# Patient Record
Sex: Male | Born: 1965 | Race: White | Hispanic: No | Marital: Married | Smoking: Current every day smoker
Health system: Southern US, Community
[De-identification: ages and names within clinical notes are randomized; demographics above are authoritative.]

## PROBLEM LIST (undated history)

## (undated) DIAGNOSIS — M5136 Other intervertebral disc degeneration, lumbar region: Secondary | ICD-10-CM

## (undated) DIAGNOSIS — F410 Panic disorder [episodic paroxysmal anxiety] without agoraphobia: Secondary | ICD-10-CM

## (undated) DIAGNOSIS — I10 Essential (primary) hypertension: Secondary | ICD-10-CM

## (undated) DIAGNOSIS — F419 Anxiety disorder, unspecified: Secondary | ICD-10-CM

## (undated) DIAGNOSIS — M5126 Other intervertebral disc displacement, lumbar region: Secondary | ICD-10-CM

## (undated) HISTORY — PX: HYDROCELE EXCISION / REPAIR: SUR1145

## (undated) HISTORY — PX: APPENDECTOMY: SHX54

## (undated) HISTORY — PX: HERNIA REPAIR: SHX51

---

## 2013-04-25 DIAGNOSIS — N433 Hydrocele, unspecified: Secondary | ICD-10-CM | POA: Insufficient documentation

## 2014-03-09 ENCOUNTER — Ambulatory Visit (INDEPENDENT_AMBULATORY_CARE_PROVIDER_SITE_OTHER): Payer: Self-pay | Admitting: Family Medicine

## 2014-03-09 ENCOUNTER — Encounter (INDEPENDENT_AMBULATORY_CARE_PROVIDER_SITE_OTHER): Payer: Self-pay

## 2014-03-09 DIAGNOSIS — E785 Hyperlipidemia, unspecified: Secondary | ICD-10-CM

## 2014-03-09 DIAGNOSIS — F411 Generalized anxiety disorder: Secondary | ICD-10-CM

## 2014-03-09 DIAGNOSIS — R5383 Other fatigue: Secondary | ICD-10-CM

## 2014-03-09 DIAGNOSIS — E559 Vitamin D deficiency, unspecified: Secondary | ICD-10-CM

## 2014-03-09 DIAGNOSIS — I10 Essential (primary) hypertension: Secondary | ICD-10-CM

## 2014-03-09 LAB — POCT CBC
GRANULOCYTE PERCENT: 76.7 % (ref 37–80)
HCT, POC: 48.3 % (ref 43.5–53.7)
Hemoglobin: 15.4 g/dL (ref 14.1–18.1)
Lymph, poc: 1.5 (ref 0.6–3.4)
MCH, POC: 30.2 pg (ref 27–31.2)
MCHC: 31.9 g/dL (ref 31.8–35.4)
MCV: 94.6 fL (ref 80–97)
MPV: 7.7 fL (ref 0–99.8)
PLATELET COUNT, POC: 311 10*3/uL (ref 142–424)
POC Granulocyte: 5.4 (ref 2–6.9)
POC LYMPH %: 20.8 % (ref 10–50)
RBC: 5.1 M/uL (ref 4.69–6.13)
RDW, POC: 12.8 %
WBC: 7 10*3/uL (ref 4.6–10.2)

## 2014-03-09 MED ORDER — LORAZEPAM 0.5 MG PO TABS: 0.5000 mg | ORAL_TABLET | Freq: Three times a day (TID) | ORAL | Status: DC | PRN

## 2014-03-09 MED ORDER — TIZANIDINE HCL 4 MG PO TABS: 4.0000 mg | ORAL_TABLET | Freq: Four times a day (QID) | ORAL | Status: DC | PRN

## 2014-03-09 MED ORDER — BUSPIRONE HCL 15 MG PO TABS: 15.0000 mg | ORAL_TABLET | Freq: Two times a day (BID) | ORAL | Status: DC

## 2014-03-09 NOTE — Progress Notes (Signed)
   Subjective:    Patient ID: Stephen Campos, male    DOB: 08/20/1965, 49 y.o.   MRN: [id number]  HPI  [facility].  He is a former patient of Dr Darlyn ReadStacks.  He is here for a follow up of chronic medical problems which include hypertension, hyperlipidemia, Vitamin D deficiency and chronic back pain.    Review of Systems     Objective:   Physical Exam There were no vitals taken for this visit.        Assessment & Plan:  There were no vitals taken for this visit.

## 2014-03-09 NOTE — Patient Instructions (Signed)
DASH Eating Plan °DASH stands for "Dietary Approaches to Stop Hypertension." The DASH eating plan is a healthy eating plan that has been shown to reduce high blood pressure (hypertension). Additional health benefits may include reducing the risk of type 2 diabetes mellitus, heart disease, and stroke. The DASH eating plan may also help with weight loss. °WHAT DO I NEED TO KNOW ABOUT THE DASH EATING PLAN? °For the DASH eating plan, you will follow these general guidelines: °· Choose foods with a percent daily value for sodium of less than 5% (as listed on the food label). °· Use salt-free seasonings or herbs instead of table salt or sea salt. °· Check with your health care provider or pharmacist before using salt substitutes. °· Eat lower-sodium products, often labeled as "lower sodium" or "no salt added." °· Eat fresh foods. °· Eat more vegetables, fruits, and low-fat dairy products. °· Choose whole grains. Look for the word "whole" as the first word in the ingredient list. °· Choose fish and skinless chicken or turkey more often than red meat. Limit fish, poultry, and meat to 6 oz (170 g) each day. °· Limit sweets, desserts, sugars, and sugary drinks. °· Choose heart-healthy fats. °· Limit cheese to 1 oz (28 g) per day. °· Eat more home-cooked food and less restaurant, buffet, and fast food. °· Limit fried foods. °· Cook foods using methods other than frying. °· Limit canned vegetables. If you do use them, rinse them well to decrease the sodium. °· When eating at a restaurant, ask that your food be prepared with less salt, or no salt if possible. °WHAT FOODS CAN I EAT? °Seek help from a dietitian for individual calorie needs. °Grains °Whole grain or whole wheat bread. Brown rice. Whole grain or whole wheat pasta. Quinoa, bulgur, and whole grain cereals. Low-sodium cereals. Corn or whole wheat flour tortillas. Whole grain cornbread. Whole grain crackers. Low-sodium crackers. °Vegetables °Fresh or frozen vegetables  (raw, steamed, roasted, or grilled). Low-sodium or reduced-sodium tomato and vegetable juices. Low-sodium or reduced-sodium tomato sauce and paste. Low-sodium or reduced-sodium canned vegetables.  °Fruits °All fresh, canned (in natural juice), or frozen fruits. °Meat and Other Protein Products °Ground beef (85% or leaner), grass-fed beef, or beef trimmed of fat. Skinless chicken or turkey. Ground chicken or turkey. Pork trimmed of fat. All fish and seafood. Eggs. Dried beans, peas, or lentils. Unsalted nuts and seeds. Unsalted canned beans. °Dairy °Low-fat dairy products, such as skim or 1% milk, 2% or reduced-fat cheeses, low-fat ricotta or cottage cheese, or plain low-fat yogurt. Low-sodium or reduced-sodium cheeses. °Fats and Oils °Tub margarines without trans fats. Light or reduced-fat mayonnaise and salad dressings (reduced sodium). Avocado. Safflower, olive, or canola oils. Natural peanut or almond butter. °Other °Unsalted popcorn and pretzels. °The items listed above may not be a complete list of recommended foods or beverages. Contact your dietitian for more options. °WHAT FOODS ARE NOT RECOMMENDED? °Grains °White bread. White pasta. White rice. Refined cornbread. Bagels and croissants. Crackers that contain trans fat. °Vegetables °Creamed or fried vegetables. Vegetables in a cheese sauce. Regular canned vegetables. Regular canned tomato sauce and paste. Regular tomato and vegetable juices. °Fruits °Dried fruits. Canned fruit in light or heavy syrup. Fruit juice. °Meat and Other Protein Products °Fatty cuts of meat. Ribs, chicken wings, bacon, sausage, bologna, salami, chitterlings, fatback, hot dogs, bratwurst, and packaged luncheon meats. Salted nuts and seeds. Canned beans with salt. °Dairy °Whole or 2% milk, cream, half-and-half, and cream cheese. Whole-fat or sweetened yogurt. Full-fat   cheeses or blue cheese. Nondairy creamers and whipped toppings. Processed cheese, cheese spreads, or cheese  curds. °Condiments °Onion and garlic salt, seasoned salt, table salt, and sea salt. Canned and packaged gravies. Worcestershire sauce. Tartar sauce. Barbecue sauce. Teriyaki sauce. Soy sauce, including reduced sodium. Steak sauce. Fish sauce. Oyster sauce. Cocktail sauce. Horseradish. Ketchup and mustard. Meat flavorings and tenderizers. Bouillon cubes. Hot sauce. Tabasco sauce. Marinades. Taco seasonings. Relishes. °Fats and Oils °Butter, stick margarine, lard, shortening, ghee, and bacon fat. Coconut, palm kernel, or palm oils. Regular salad dressings. °Other °Pickles and olives. Salted popcorn and pretzels. °The items listed above may not be a complete list of foods and beverages to avoid. Contact your dietitian for more information. °WHERE CAN I FIND MORE INFORMATION? °National Heart, Lung, and [employer] Institute: www.nhlbi.nih.gov/health/health-topics/topics/dash/ °Document Released: 01/08/2011 Document Revised: 06/05/2013 Document Reviewed: 11/23/2012 °ExitCare® Patient Information ©2015 [employer]. This information is not intended to replace advice given to you by your health care provider. Make sure you discuss any questions you have with your health care provider. ° °

## 2014-03-12 LAB — NMR, LIPOPROFILE
Cholesterol: 189 mg/dL (ref 100–199)
HDL Cholesterol by NMR: 53 mg/dL (ref >39)
HDL Particle Number: 48 umol/L (ref >=30.5)
LDL Particle Number: 1112 nmol/L — ABNORMAL HIGH (ref <1000)
LDL Size: 19.6 nm (ref >20.5)
LDL-C: 73 mg/dL (ref 0–99)
LP-IR Score: 82 — ABNORMAL HIGH (ref <=45)
Small LDL Particle Number: 791 nmol/L — ABNORMAL HIGH (ref <=527)
TRIGLYCERIDES BY NMR: 317 mg/dL — AB (ref 0–149)

## 2014-03-12 LAB — THYROID PANEL WITH TSH
Free Thyroxine Index: 1.9 (ref 1.2–4.9)
T3 UPTAKE RATIO: 30 % (ref 24–39)
T4, Total: 6.4 ug/dL (ref 4.5–12.0)
TSH: 1.92 u[IU]/mL (ref 0.450–4.500)

## 2014-03-12 LAB — CMP14+EGFR
A/G RATIO: 1.7 (ref 1.1–2.5)
ALT: 36 IU/L (ref 0–44)
AST: 35 IU/L (ref 0–40)
Albumin: 4.2 g/dL (ref 3.5–5.5)
Alkaline Phosphatase: 84 IU/L (ref 39–117)
BUN / CREAT RATIO: 17 (ref 9–20)
BUN: 16 mg/dL (ref 6–24)
Bilirubin Total: 0.4 mg/dL (ref 0.0–1.2)
CO2: 23 mmol/L (ref 18–29)
CREATININE: 0.96 mg/dL (ref 0.76–1.27)
Calcium: 9.1 mg/dL (ref 8.7–10.2)
Chloride: 105 mmol/L (ref 97–108)
GFR calc Af Amer: 108 mL/min/{1.73_m2} (ref >59)
GFR calc non Af Amer: 94 mL/min/{1.73_m2} (ref >59)
Globulin, Total: 2.5 g/dL (ref 1.5–4.5)
Glucose: 98 mg/dL (ref 65–99)
Potassium: 5.5 mmol/L — ABNORMAL HIGH (ref 3.5–5.2)
Sodium: 143 mmol/L (ref 134–144)
Total Protein: 6.7 g/dL (ref 6.0–8.5)

## 2014-03-12 LAB — VITAMIN D 25 HYDROXY (VIT D DEFICIENCY, FRACTURES): Vit D, 25-Hydroxy: 23.4 ng/mL — ABNORMAL LOW (ref 30.0–100.0)

## 2014-03-13 ENCOUNTER — Telehealth: Payer: Self-pay | Admitting: *Deleted

## 2014-03-13 NOTE — Telephone Encounter (Signed)
-----   Message from Mechele ClaudeWarren Stacks, MD sent at 03/12/2014  6:45 PM EST ----- Stephen JunkerHello Stephen Campos, Your cholesterol was checked for LDL particle size and number. There is a slight excess of particles and a small size, but not sufficient to require more medication. Work on diet exercise and weight loss isntead. Recheck in 6 mos. Best Regards, Mechele ClaudeWarren Stacks, M.D.

## 2014-03-13 NOTE — Telephone Encounter (Signed)
Lmtcb regarding test results.

## 2014-03-13 NOTE — Progress Notes (Signed)
Subjective:  Patient ID: Stephen Campos, male    DOB: May 28, 1965  Age: 49 y.o. MRN: [id number]  CC: Hypertension; Hyperlipidemia; and Back Pain   HPI [name] presents for Patient in for follow-up of elevated cholesterol. Doing well without complaints on current medication. Denies side effects of statin including myalgia and arthralgia and nausea. Also in today for liver function testing. Currently no chest pain, shortness of breath or other cardiovascular related symptoms noted.  Patient in for follow-up of hypertension. Patient has no history of headache chest pain or shortness of breath or recent cough. Patient also denies symptoms of TIA such as numbness weakness lateralizing. Patient checks  blood pressure at home and has not had any elevated readings recently. Patient denies side effects from his medication. States taking it regularly.  Continues to take vitamin D. The patient is due to have the level checked.   Back pain has been increasing recently. He has responded well to tizanidine in the past and would like to have that prescribed again.  History Stephen Campos has no past medical history on file.   He has no past surgical history on file.   His family history is not on file.He has no tobacco, alcohol, and drug history on file.  No current outpatient prescriptions on file prior to visit.   No current facility-administered medications on file prior to visit.    ROS Review of Systems  Constitutional: Negative for fever, chills, diaphoresis and unexpected weight change.  HENT: Negative for congestion, hearing loss, rhinorrhea, sore throat and trouble swallowing.   Respiratory: Negative for cough, chest tightness, shortness of breath and wheezing.   Gastrointestinal: Negative for nausea, vomiting, abdominal pain, diarrhea, constipation and abdominal distention.  Endocrine: Negative for cold intolerance and heat intolerance.  Genitourinary: Negative for dysuria, hematuria and  flank pain.  Musculoskeletal: Negative for joint swelling and arthralgias.  Skin: Negative for rash.  Neurological: Negative for dizziness and headaches.  Psychiatric/Behavioral: Negative for dysphoric mood, decreased concentration and agitation. The patient is not nervous/anxious.     Objective:  There were no vitals taken for this visit.  BP Readings from Last 3 Encounters:  No data found for BP    Wt Readings from Last 3 Encounters:  No data found for Wt     Physical Exam  Constitutional: He is oriented to person, place, and time. He appears well-developed and well-nourished. No distress.  HENT:  Head: Normocephalic and atraumatic.  Right Ear: External ear normal.  Left Ear: External ear normal.  Nose: Nose normal.  Mouth/Throat: Oropharynx is clear and moist.  Eyes: Conjunctivae and EOM are normal. Pupils are equal, round, and reactive to light.  Neck: Normal range of motion. Neck supple. No thyromegaly present.  Cardiovascular: Normal rate, regular rhythm and normal heart sounds.   No murmur heard. Pulmonary/Chest: Effort normal and breath sounds normal. No respiratory distress. He has no wheezes. He has no rales.  Abdominal: Soft. Bowel sounds are normal. He exhibits no distension. There is no tenderness.  Lymphadenopathy:    He has no cervical adenopathy.  Neurological: He is alert and oriented to person, place, and time. He has normal reflexes.  Skin: Skin is warm and dry.  Psychiatric: He has a normal mood and affect. His behavior is normal. Judgment and thought content normal.    No results found for: HGBA1C  Lab Results  Component Value Date   WBC 7.0 03/09/2014   HGB 15.4 03/09/2014   HCT 48.3 03/09/2014  GLUCOSE 98 03/09/2014   CHOL 189 03/09/2014   TRIG 317* 03/09/2014   HDL 53 03/09/2014   ALT 36 03/09/2014   AST 35 03/09/2014   NA 143 03/09/2014   K 5.5* 03/09/2014   CL 105 03/09/2014   CREATININE 0.96 03/09/2014   BUN 16 03/09/2014   CO2 23  03/09/2014   TSH 1.920 03/09/2014    Patient was never admitted.  Assessment & Plan:   Paxton was seen today for hypertension, hyperlipidemia and back pain.  Diagnoses and associated orders for this visit:  Other fatigue - POCT CBC - Cancel: Thyroid Panel With TSH - LORazepam (ATIVAN) 0.5 MG tablet; Take 1 tablet (0.5 mg total) by mouth 3 (three) times daily as needed. - Thyroid Panel With TSH  Essential hypertension, benign - CMP14+EGFR  Hyperlipemia - CMP14+EGFR - NMR, lipoprofile  Vitamin D deficiency - Vit D  25 hydroxy (rtn osteoporosis monitoring)  Generalized anxiety disorder  Other Orders - tiZANidine (ZANAFLEX) 4 MG tablet; Take 1 tablet (4 mg total) by mouth every 6 (six) hours as needed for muscle spasms. - busPIRone (BUSPAR) 15 MG tablet; Take 1 tablet (15 mg total) by mouth 2 (two) times daily.    I have changed Mr. Lavallee busPIRone and LORazepam. I am also having him start on tiZANidine. Additionally, I am having him maintain his Vitamin D3, HYDROcodone-acetaminophen, valACYclovir, metoprolol succinate, and atorvastatin.  Meds ordered this encounter  Medications  . DISCONTD: busPIRone (BUSPAR) 15 MG tablet    Sig: Take 15 mg by mouth 2 (two) times daily.  . Cholecalciferol (VITAMIN D3) 2000 UNITS capsule    Sig: Take by mouth.  Marland Kitchen HYDROcodone-acetaminophen (NORCO) 10-325 MG per tablet    Sig: Take 1 tablet by mouth 4 (four) times daily as needed.  Marland Kitchen DISCONTD: LORazepam (ATIVAN) 0.5 MG tablet    Sig: Take 0.5 mg by mouth 3 (three) times daily as needed.  . valACYclovir (VALTREX) 500 MG tablet    Sig: Take 1 g by mouth daily as needed.  . metoprolol succinate (TOPROL-XL) 100 MG 24 hr tablet    Sig: Take 100 mg by mouth daily.    Refill:  1  . atorvastatin (LIPITOR) 40 MG tablet    Sig: Take 1 tablet by mouth daily.    Refill:  0  . tiZANidine (ZANAFLEX) 4 MG tablet    Sig: Take 1 tablet (4 mg total) by mouth every 6 (six) hours as needed for muscle  spasms.    Dispense:  60 tablet    Refill:  5  . busPIRone (BUSPAR) 15 MG tablet    Sig: Take 1 tablet (15 mg total) by mouth 2 (two) times daily.    Dispense:  60 tablet    Refill:  5  . LORazepam (ATIVAN) 0.5 MG tablet    Sig: Take 1 tablet (0.5 mg total) by mouth 3 (three) times daily as needed.    Dispense:  90 tablet    Refill:  5   Follow-up: No Follow-up on file.  Claretta Fraise, M.D.

## 2014-03-14 ENCOUNTER — Encounter: Payer: Self-pay | Admitting: Family Medicine

## 2014-03-21 ENCOUNTER — Encounter: Payer: Self-pay | Admitting: Family Medicine

## 2014-03-21 NOTE — Telephone Encounter (Signed)
.  RC

## 2014-03-29 ENCOUNTER — Emergency Department (HOSPITAL_COMMUNITY)
Admission: EM | Admit: 2014-03-29 | Discharge: 2014-03-29 | Disposition: A | Discharge status: Home / Self Care | Payer: Self-pay | Attending: Emergency Medicine | Admitting: Emergency Medicine

## 2014-03-29 ENCOUNTER — Emergency Department (HOSPITAL_COMMUNITY): Payer: Self-pay

## 2014-03-29 ENCOUNTER — Encounter (HOSPITAL_COMMUNITY): Payer: Self-pay | Admitting: Emergency Medicine

## 2014-03-29 DIAGNOSIS — R4701 Aphasia: Secondary | ICD-10-CM | POA: Insufficient documentation

## 2014-03-29 DIAGNOSIS — F1310 Sedative, hypnotic or anxiolytic abuse, uncomplicated: Secondary | ICD-10-CM | POA: Insufficient documentation

## 2014-03-29 DIAGNOSIS — R000 Tachycardia, unspecified: Secondary | ICD-10-CM | POA: Insufficient documentation

## 2014-03-29 DIAGNOSIS — F419 Anxiety disorder, unspecified: Secondary | ICD-10-CM | POA: Insufficient documentation

## 2014-03-29 DIAGNOSIS — Z79899 Other long term (current) drug therapy: Secondary | ICD-10-CM | POA: Insufficient documentation

## 2014-03-29 DIAGNOSIS — E780 Pure hypercholesterolemia: Secondary | ICD-10-CM | POA: Insufficient documentation

## 2014-03-29 DIAGNOSIS — Z8739 Personal history of other diseases of the musculoskeletal system and connective tissue: Secondary | ICD-10-CM | POA: Insufficient documentation

## 2014-03-29 DIAGNOSIS — Z720 Tobacco use: Secondary | ICD-10-CM | POA: Insufficient documentation

## 2014-03-29 DIAGNOSIS — I10 Essential (primary) hypertension: Secondary | ICD-10-CM | POA: Insufficient documentation

## 2014-03-29 DIAGNOSIS — R410 Disorientation, unspecified: Secondary | ICD-10-CM

## 2014-03-29 DIAGNOSIS — R55 Syncope and collapse: Secondary | ICD-10-CM | POA: Insufficient documentation

## 2014-03-29 HISTORY — DX: Anxiety disorder, unspecified: F41.9

## 2014-03-29 HISTORY — DX: Panic disorder (episodic paroxysmal anxiety): F41.0

## 2014-03-29 HISTORY — DX: Other intervertebral disc displacement, lumbar region: M51.26

## 2014-03-29 HISTORY — DX: Essential (primary) hypertension: I10

## 2014-03-29 HISTORY — DX: Other intervertebral disc degeneration, lumbar region: M51.36

## 2014-03-29 LAB — BASIC METABOLIC PANEL
ANION GAP: 10 (ref 5–15)
BUN: 14 mg/dL (ref 6–23)
CALCIUM: 8.7 mg/dL (ref 8.4–10.5)
CO2: 20 mmol/L (ref 19–32)
CREATININE: 0.94 mg/dL (ref 0.50–1.35)
Chloride: 107 mmol/L (ref 96–112)
GFR calc Af Amer: >90 mL/min (ref >90)
GFR calc non Af Amer: >90 mL/min (ref >90)
GLUCOSE: 119 mg/dL — AB (ref 70–99)
Potassium: 3.9 mmol/L (ref 3.5–5.1)
Sodium: 137 mmol/L (ref 135–145)

## 2014-03-29 LAB — CBC WITH DIFFERENTIAL/PLATELET
BASOS PCT: 1 % (ref 0–1)
Basophils Absolute: 0 10*3/uL (ref 0.0–0.1)
EOS ABS: 0.1 10*3/uL (ref 0.0–0.7)
Eosinophils Relative: 1 % (ref 0–5)
HCT: 42.5 % (ref 39.0–52.0)
Hemoglobin: 15 g/dL (ref 13.0–17.0)
Lymphocytes Relative: 14 % (ref 12–46)
Lymphs Abs: 1.2 10*3/uL (ref 0.7–4.0)
MCH: 32.2 pg (ref 26.0–34.0)
MCHC: 35.3 g/dL (ref 30.0–36.0)
MCV: 91.2 fL (ref 78.0–100.0)
Monocytes Absolute: 0.7 10*3/uL (ref 0.1–1.0)
Monocytes Relative: 9 % (ref 3–12)
Neutro Abs: 6.4 10*3/uL (ref 1.7–7.7)
Neutrophils Relative %: 75 % (ref 43–77)
Platelets: 247 10*3/uL (ref 150–400)
RBC: 4.66 MIL/uL (ref 4.22–5.81)
RDW: 12.7 % (ref 11.5–15.5)
WBC: 8.4 10*3/uL (ref 4.0–10.5)

## 2014-03-29 LAB — RAPID URINE DRUG SCREEN, HOSP PERFORMED
Amphetamines: NOT DETECTED
Barbiturates: NOT DETECTED
Benzodiazepines: POSITIVE — AB
Cocaine: NOT DETECTED
Opiates: NOT DETECTED
TETRAHYDROCANNABINOL: NOT DETECTED

## 2014-03-29 LAB — TROPONIN I: Troponin I: <0.03 ng/mL (ref <0.031)

## 2014-03-29 LAB — D-DIMER, QUANTITATIVE (NOT AT ARMC): D-Dimer, Quant: 0.31 ug/mL-FEU (ref 0.00–0.48)

## 2014-03-29 MED ORDER — LORAZEPAM 1 MG PO TABS
1.0000 mg | ORAL_TABLET | Freq: Once | ORAL | Status: AC
  Administered 2014-03-29: 1 mg via ORAL
  Filled 2014-03-29: qty 1

## 2014-03-29 NOTE — Discharge Instructions (Signed)
Take your Metoprolol as soon as you get home, return to the ER if you develop severe or worsening symptoms.  Have your doctor recheck you in 2 days

## 2014-03-29 NOTE — ED Notes (Signed)
Pt to CT at this time.

## 2014-03-29 NOTE — ED Notes (Signed)
MD at bedside. 

## 2014-03-29 NOTE — ED Notes (Signed)
Pt to MRI

## 2014-03-29 NOTE — ED Provider Notes (Signed)
CSN: [id number]     Arrival date & time 03/29/14  1649 History   First MD Initiated Contact with Patient 03/29/14 1651     Chief Complaint  Patient presents with  . Tachycardia     (Consider location/radiation/quality/duration/timing/severity/associated sxs/prior Treatment) HPI Comments: The patient is a 49 year old male with a history of hypertension and high cholesterol as well as anxiety. He takes metoprolol for over 10 years, BuSpar at night because of the side effects of the medication which make him very anxious and jittery. He also has a history of panic attacks where he describes feeling like he is going to pass out, numbness and tingling in his hands and sometimes chest pain. He reports that today was like any other day, he was driving to the pharmacy, on the way home he developed acute onset of blurry vision which became tunnel, a wavy sensation in his vision going across his visual field followed by a feeling of near syncope. He had to pull over to the side of the road and call for an ambulance. The symptoms started to resolve while he was in the ambulance. He could feel his heart racing. The symptoms were persistent, they gradually improved and have now resolved except for the fast heart rate. He denies any focal unilateral weakness but does report that he had difficulty with speech when he was on the phone with the 911 operator stating that he had trouble with word finding and couldn't read a street sign  The history is provided by the patient and the EMS personnel.    Past Medical History  Diagnosis Date  . Anxiety   . Benign hypertension   . Bulging lumbar disc   . Panic attacks    Past Surgical History  Procedure Laterality Date  . Appendectomy    . Hernia repair    . Hydrocele excision / repair     History reviewed. No pertinent family history. History  Substance Use Topics  . Smoking status: Current Every Day Smoker -- 1.00 packs/day    Types: Cigarettes  .  Smokeless tobacco: Not on file  . Alcohol Use: No    Review of Systems  All other systems reviewed and are negative.     Allergies  Review of patient's allergies indicates no known allergies.  Home Medications   Prior to Admission medications   Medication Sig Start Date End Date Taking? Authorizing Provider  atorvastatin (LIPITOR) 40 MG tablet Take 1 tablet by mouth daily. 02/28/14  Yes Historical Provider, MD  busPIRone (BUSPAR) 15 MG tablet Take 1 tablet (15 mg total) by mouth 2 (two) times daily. Patient taking differently: Take 30 mg by mouth at bedtime.  03/09/14  Yes Mechele Claude, MD  Cholecalciferol (VITAMIN D3) 2000 UNITS capsule Take by mouth.   Yes Historical Provider, MD  HYDROcodone-acetaminophen (NORCO) 10-325 MG per tablet Take 1 tablet by mouth 4 (four) times daily as needed.   Yes Historical Provider, MD  LORazepam (ATIVAN) 0.5 MG tablet Take 1 tablet (0.5 mg total) by mouth 3 (three) times daily as needed. 03/09/14  Yes Mechele Claude, MD  metoprolol succinate (TOPROL-XL) 100 MG 24 hr tablet Take 100 mg by mouth daily. 02/27/14  Yes Historical Provider, MD  tiZANidine (ZANAFLEX) 4 MG tablet Take 1 tablet (4 mg total) by mouth every 6 (six) hours as needed for muscle spasms. 03/09/14  Yes Mechele Claude, MD  valACYclovir (VALTREX) 500 MG tablet Take 1 g by mouth daily as needed. 02/09/13  Yes  Historical Provider, MD   BP 142/85 mmHg  Pulse 121  Temp(Src) 97.8 F (36.6 C) (Oral)  Resp 20  SpO2 96% Physical Exam  Constitutional: He appears well-developed and well-nourished. No distress.  HENT:  Head: Normocephalic and atraumatic.  Mouth/Throat: Oropharynx is clear and moist. No oropharyngeal exudate.  Eyes: Conjunctivae and EOM are normal. Pupils are equal, round, and reactive to light. Right eye exhibits no discharge. Left eye exhibits no discharge. No scleral icterus.  Neck: Normal range of motion. Neck supple. No JVD present. No thyromegaly present.  Cardiovascular:  Regular rhythm, normal heart sounds and intact distal pulses.  Exam reveals no gallop and no friction rub.   No murmur heard. Tachycardia present  Pulmonary/Chest: Effort normal and breath sounds normal. No respiratory distress. He has no wheezes. He has no rales.  Abdominal: Soft. Bowel sounds are normal. He exhibits no distension and no mass. There is no tenderness.  Musculoskeletal: Normal range of motion. He exhibits no edema or tenderness.  Lymphadenopathy:    He has no cervical adenopathy.  Neurological: He is alert. Coordination normal.  Speech is clear, cranial nerves III through XII are intact, memory is intact, strength is normal in all 4 extremities including grips, sensation is intact to light touch and pinprick in all 4 extremities. Coordination as tested by finger-nose-finger is normal, no limb ataxia. Normal gait, normal reflexes at the patellar tendons bilaterally  Skin: Skin is warm and dry. No rash noted. No erythema.  Psychiatric: He has a normal mood and affect. His behavior is normal.  Nursing note and vitals reviewed.   ED Course  Procedures (including critical care time) Labs Review Labs Reviewed  BASIC METABOLIC PANEL - Abnormal; Notable for the following:    Glucose, Bld 119 (*)    All other components within normal limits  URINE RAPID DRUG SCREEN (HOSP PERFORMED) - Abnormal; Notable for the following:    Benzodiazepines POSITIVE (*)    All other components within normal limits  D-DIMER, QUANTITATIVE  CBC WITH DIFFERENTIAL/PLATELET  TROPONIN I    Imaging Review Ct Head Wo Contrast  03/29/2014   CLINICAL DATA:  49 year old with blurred vision and lightheadedness.  EXAM: CT HEAD WITHOUT CONTRAST  TECHNIQUE: Contiguous axial images were obtained from the base of the skull through the vertex without contrast.  COMPARISON:  None  FINDINGS: No evidence for acute hemorrhage, mass lesion, midline shift, hydrocephalus or large infarct. Visualized paranasal sinuses are  clear. No acute bone abnormality. Visualized mastoid air cells are clear.  IMPRESSION: Negative head CT.   Electronically Signed   By: Richarda Overlie M.D.   On: 03/29/2014 18:51   Mr Brain Wo Contrast  03/29/2014   CLINICAL DATA:  Expressive aphasia today, light headedness with flushing, palpitations, vision changes, nausea, diarrhea.  EXAM: MRI HEAD WITHOUT CONTRAST  TECHNIQUE: Multiplanar, multiecho pulse sequences of the brain and surrounding structures were obtained without intravenous contrast.  COMPARISON:  CT of the head March 29, 2010 1833 hours  FINDINGS: The ventricles and sulci are normal. No abnormal parenchymal signal, mass lesions or mass of affect. No reduced diffusion to suggest acute ischemia. No susceptibility artifact to suggest hemorrhage.  No abnormal extra-axial fluid collection. Normal major intracranial vascular flow voids noted at the skull base. No abnormal calvarial bone marrow signal. Ocular globes and orbital contents are unremarkable though not tailored for evaluation. Visualized paranasal sinuses and mastoid air cells appear well-aerated. No abnormal sellar expansion. Craniocervical junction is maintained.  IMPRESSION: No acute  intracranial process ; normal noncontrast MRI of the brain.   Electronically Signed   By: Awilda Metroourtnay  Bloomer   On: 03/29/2014 22:35     EKG Interpretation   Date/Time:  Thursday March 29 2014 16:56:39 EST Ventricular Rate:  124 PR Interval:  154 QRS Duration: 85 QT Interval:  310 QTC Calculation: 445 R Axis:   -4 Text Interpretation:  Sinus tachycardia Abnormal ekg No old tracing to  compare Confirmed by Michaeline Eckersley  MD, Quron Ruddy ([id number]) on 03/29/2014 5:10:46 PM      MDM   Final diagnoses:  Near syncope  Expressive aphasia  Anxiety  Tachycardia    The patient does have a significant history of anxiety, he does not seem to have any recent stressors or anxiety provoking incidents, his exam is rather unremarkable including neurologic and  cardiac exams except for a sinus tachycardia. EKG reveals no signs of ischemia. We'll consult with neurology, d-dimer, labs. Patient is in agreement with the plan.  Labs unremarkable, CT scan of the head unremarkable, MRI of the brain unremarkable, no signs of stroke. Neurology has seen the patient in consultation and agrees that this does not appear consistent with a stroke, they recommend that the patient can be discharged from a neurologic standpoint. The tachycardia has improved, when I just examined him his pulse was 110. He has no other symptoms at this time, he feels well, his significant other is here at the bedside, they have been made aware of all results and the indications for return, they are in agreement with the treatment plan, the patient takes metoprolol at night and will take this as soon as he gets home.  Vida RollerBrian D Shailynn Fong, MD 03/29/14 [id number]

## 2014-03-29 NOTE — ED Notes (Addendum)
Pt arrives via [facility], states he was driving and felt lightheaded, with flushing and warmth of his face as well as some palpitations and "waviness" in his vision. HR 130 upon EMS arrival, 110 after 800 ml NS. Pt reports some nausea and diarrhea today as well as hx of anxiety. Pt alert, oriented, nad.

## 2014-03-29 NOTE — Consult Note (Signed)
NEURO HOSPITALIST CONSULT NOTE    Reason for Consult: lightheadedness, visual disturbances, confusion.  HPI:                                                                                                                                          Stephen Campos is an 49 y.o. male with a past medical history significant for HTN, hypercholesterolemia, and panic attacks, comes in for further evaluation of the above stated symptoms. Stephen Campos expressed that today was a normal day until this afternoon when he was driving to the pharmacy and suddenly had a lightheaded sensation " like I was about to pass out, things were closing up on me", then started having wavy vision, his heart was pounding, and he was kind of confused and disoriented. The episode lasted for 10 or 15 minutes and there was not associated HA, vertigo, double vision, vision loss, focal weakness or numbness, slurred speech, or imbalance. BP was elevated but unremarkable serologies. UDS positive only for benzodiazepines. CT brain reviewed by myself showed no acute intracranial abnormality.     Past Medical History  Diagnosis Date  . Anxiety   . Benign hypertension   . Bulging lumbar disc   . Panic attacks     Past Surgical History  Procedure Laterality Date  . Appendectomy    . Hernia repair    . Hydrocele excision / repair      History reviewed. No pertinent family history.  Family History: no epilepsy, brain tumors, or brain aneurysms.  Social History:  reports that he has been smoking Cigarettes.  He has been smoking about 1.00 pack per day. He does not have any smokeless tobacco history on file. He reports that he does not drink alcohol. His drug history is not on file.  No Known Allergies  MEDICATIONS:                                                                                                                     I have reviewed the patient's current medications.   ROS:  History obtained from the patient and chart review  General ROS: negative for - chills, fatigue, fever, night sweats, weight gain or weight loss Psychological ROS: negative for - behavioral disorder, hallucinations, memory difficulties, mood swings or suicidal ideation Ophthalmic ROS: negative for - blurry vision, double vision, eye pain or loss of vision ENT ROS: negative for - epistaxis, nasal discharge, oral lesions, sore throat, tinnitus or vertigo Allergy and Immunology ROS: negative for - hives or itchy/watery eyes Hematological and Lymphatic ROS: negative for - bleeding problems, bruising or swollen lymph nodes Endocrine ROS: negative for - galactorrhea, hair pattern changes, polydipsia/polyuria or temperature intolerance Respiratory ROS: negative for - cough, hemoptysis, shortness of breath or wheezing Cardiovascular ROS: negative for - chest pain, dyspnea on exertion, edema or irregular heartbeat Gastrointestinal ROS: negative for - abdominal pain, diarrhea, hematemesis, nausea/vomiting or stool incontinence Genito-Urinary ROS: negative for - dysuria, hematuria, incontinence or urinary frequency/urgency Musculoskeletal ROS: negative for - joint swelling or muscular weakness Neurological ROS: as noted in HPI Dermatological ROS: negative for rash and skin lesion changes  Physical exam: pleasant male in no apparent distress. Blood pressure 133/84, pulse 118, temperature 98.7 F (37.1 C), temperature source Oral, resp. rate 18, SpO2 96 %. Head: normocephalic. Neck: supple, no bruits, no JVD. Cardiac: no murmurs. Lungs: clear. Abdomen: soft, no tender, no mass. Extremities: no edema. Skin: no edema  Neurologic Examination:                                                                                                      General: Mental Status: Alert, oriented, thought  content appropriate.  Speech fluent without evidence of aphasia.  Able to follow 3 step commands without difficulty. Cranial Nerves: II: Discs flat bilaterally; Visual fields grossly normal, pupils equal, round, reactive to light and accommodation III,IV, VI: ptosis not present, extra-ocular motions intact bilaterally V,VII: smile symmetric, facial light touch sensation normal bilaterally VIII: hearing normal bilaterally IX,X: gag reflex present XI: bilateral shoulder shrug XII: midline tongue extension without atrophy or fasciculations  Motor: Right : Upper extremity   5/5    Left:     Upper extremity   5/5  Lower extremity   5/5     Lower extremity   5/5 Tone and bulk:normal tone throughout; no atrophy noted Sensory: Pinprick and light touch intact throughout, bilaterally Deep Tendon Reflexes:  Right: Upper Extremity   Left: Upper extremity   biceps (C-5 to C-6) 2/4   biceps (C-5 to C-6) 2/4 tricep (C7) 2/4    triceps (C7) 2/4 Brachioradialis (C6) 2/4  Brachioradialis (C6) 2/4  Lower Extremity Lower Extremity  quadriceps (L-2 to L-4) 2/4   quadriceps (L-2 to L-4) 2/4 Achilles (S1) 2/4   Achilles (S1) 2/4  Plantars: Right: downgoing   Left: downgoing Cerebellar: normal finger-to-nose,  normal heel-to-shin test Gait:  No tested due to safety reasons     Lab Results  Component Value Date/Time   CHOL 189 03/09/2014 11:58 AM    Results for orders placed or performed during the hospital encounter of 03/29/14 (from the past 48  hour(s))  D-dimer, quantitative     Status: None   Collection Time: 03/29/14  5:10 PM  Result Value Ref Range   D-Dimer, Quant 0.31 0.00 - 0.48 ug/mL-FEU    Comment:        AT THE INHOUSE ESTABLISHED CUTOFF VALUE OF 0.48 ug/mL FEU, THIS ASSAY HAS BEEN DOCUMENTED IN THE LITERATURE TO HAVE A SENSITIVITY AND NEGATIVE PREDICTIVE VALUE OF AT LEAST 98 TO 99%.  THE TEST RESULT SHOULD BE CORRELATED WITH AN ASSESSMENT OF THE CLINICAL PROBABILITY OF DVT  / VTE.   CBC with Differential/Platelet     Status: None   Collection Time: 03/29/14  5:10 PM  Result Value Ref Range   WBC 8.4 4.0 - 10.5 K/uL   RBC 4.66 4.22 - 5.81 MIL/uL   Hemoglobin 15.0 13.0 - 17.0 g/dL   HCT 42.5 39.0 - 52.0 %   MCV 91.2 78.0 - 100.0 fL   MCH 32.2 26.0 - 34.0 pg   MCHC 35.3 30.0 - 36.0 g/dL   RDW 12.7 11.5 - 15.5 %   Platelets 247 150 - 400 K/uL   Neutrophils Relative % 75 43 - 77 %   Neutro Abs 6.4 1.7 - 7.7 K/uL   Lymphocytes Relative 14 12 - 46 %   Lymphs Abs 1.2 0.7 - 4.0 K/uL   Monocytes Relative 9 3 - 12 %   Monocytes Absolute 0.7 0.1 - 1.0 K/uL   Eosinophils Relative 1 0 - 5 %   Eosinophils Absolute 0.1 0.0 - 0.7 K/uL   Basophils Relative 1 0 - 1 %   Basophils Absolute 0.0 0.0 - 0.1 K/uL  Basic metabolic panel     Status: Abnormal   Collection Time: 03/29/14  5:10 PM  Result Value Ref Range   Sodium 137 135 - 145 mmol/L   Potassium 3.9 3.5 - 5.1 mmol/L   Chloride 107 96 - 112 mmol/L   CO2 20 19 - 32 mmol/L   Glucose, Bld 119 (H) 70 - 99 mg/dL   BUN 14 6 - 23 mg/dL   Creatinine, Ser 0.94 0.50 - 1.35 mg/dL   Calcium 8.7 8.4 - 10.5 mg/dL   GFR calc non Af Amer >90 >90 mL/min   GFR calc Af Amer >90 >90 mL/min    Comment: (NOTE) The eGFR has been calculated using the CKD EPI equation. This calculation has not been validated in all clinical situations. eGFR's persistently <90 mL/min signify possible Chronic Kidney Disease.    Anion gap 10 5 - 15  Troponin I     Status: None   Collection Time: 03/29/14  5:10 PM  Result Value Ref Range   Troponin I <0.03 <0.031 ng/mL    Comment:        NO INDICATION OF MYOCARDIAL INJURY.   Urine rapid drug screen (hosp performed)     Status: Abnormal   Collection Time: 03/29/14  6:06 PM  Result Value Ref Range   Opiates NONE DETECTED NONE DETECTED   Cocaine NONE DETECTED NONE DETECTED   Benzodiazepines POSITIVE (A) NONE DETECTED   Amphetamines NONE DETECTED NONE DETECTED   Tetrahydrocannabinol NONE  DETECTED NONE DETECTED   Barbiturates NONE DETECTED NONE DETECTED    Comment:        DRUG SCREEN FOR MEDICAL PURPOSES ONLY.  IF CONFIRMATION IS NEEDED FOR ANY PURPOSE, NOTIFY LAB WITHIN 5 DAYS.        LOWEST DETECTABLE LIMITS FOR URINE DRUG SCREEN Drug Class  Cutoff (ng/mL) Amphetamine      1000 Barbiturate      200 Benzodiazepine   277 Tricyclics       824 Opiates          300 Cocaine          300 THC              50     Ct Head Wo Contrast  03/29/2014   CLINICAL DATA:  49 year old with blurred vision and lightheadedness.  EXAM: CT HEAD WITHOUT CONTRAST  TECHNIQUE: Contiguous axial images were obtained from the base of the skull through the vertex without contrast.  COMPARISON:  None  FINDINGS: No evidence for acute hemorrhage, mass lesion, midline shift, hydrocephalus or large infarct. Visualized paranasal sinuses are clear. No acute bone abnormality. Visualized mastoid air cells are clear.  IMPRESSION: Negative head CT.   Electronically Signed   By: Markus Daft M.D.   On: 03/29/2014 18:51   Assessment/Plan: 49 y/o with history of HTN, high cholesterol, and panic attacks, presents for evaluation of acute onset lightheadedness, visual disturbances, confusion. Neuroexamis non focal and CT brain as well as serologies are unimpressive. Had an ample discussion with patient and wife regarding potential etiologies for his symptoms. I am not quite convince he had a TIA or an acute cerebrovascular insult. Panic attack also in the differential. Will suggest MRI brain. He should be able to be discharge home tonight if MRI is negative.   Dorian Pod, MD 03/29/2014, 10:01 PM  [facility]

## 2014-04-26 ENCOUNTER — Other Ambulatory Visit: Payer: Self-pay | Admitting: *Deleted

## 2014-04-26 DIAGNOSIS — E785 Hyperlipidemia, unspecified: Secondary | ICD-10-CM

## 2014-04-26 MED ORDER — ATORVASTATIN CALCIUM 40 MG PO TABS: 40.0000 mg | ORAL_TABLET | Freq: Every day | ORAL | Status: DC

## 2014-08-27 ENCOUNTER — Ambulatory Visit: Payer: Self-pay | Admitting: Family Medicine

## 2014-08-28 ENCOUNTER — Ambulatory Visit: Payer: Self-pay | Admitting: Family Medicine

## 2014-08-30 ENCOUNTER — Encounter: Payer: Self-pay | Admitting: Family Medicine

## 2014-08-30 ENCOUNTER — Ambulatory Visit (INDEPENDENT_AMBULATORY_CARE_PROVIDER_SITE_OTHER): Payer: BLUE CROSS/BLUE SHIELD | Admitting: Family Medicine

## 2014-08-30 VITALS — BP 120/80 | HR 79 | Temp 98.4°F | Ht 72.0 in | Wt 262.6 lb

## 2014-08-30 DIAGNOSIS — I10 Essential (primary) hypertension: Secondary | ICD-10-CM | POA: Diagnosis not present

## 2014-08-30 DIAGNOSIS — E559 Vitamin D deficiency, unspecified: Secondary | ICD-10-CM | POA: Diagnosis not present

## 2014-08-30 DIAGNOSIS — R5383 Other fatigue: Secondary | ICD-10-CM

## 2014-08-30 DIAGNOSIS — Z139 Encounter for screening, unspecified: Secondary | ICD-10-CM | POA: Diagnosis not present

## 2014-08-30 DIAGNOSIS — E785 Hyperlipidemia, unspecified: Secondary | ICD-10-CM | POA: Diagnosis not present

## 2014-08-30 LAB — POCT CBC
Granulocyte percent: 76.5 %G (ref 37–80)
HEMATOCRIT: 48.6 % (ref 43.5–53.7)
Hemoglobin: 15.8 g/dL (ref 14.1–18.1)
Lymph, poc: 1.6 (ref 0.6–3.4)
MCH: 30.5 pg (ref 27–31.2)
MCHC: 32.5 g/dL (ref 31.8–35.4)
MCV: 94 fL (ref 80–97)
MPV: 7 fL (ref 0–99.8)
POC Granulocyte: 6 (ref 2–6.9)
POC LYMPH PERCENT: 20.8 %L (ref 10–50)
Platelet Count, POC: 299 10*3/uL (ref 142–424)
RBC: 5.18 M/uL (ref 4.69–6.13)
RDW, POC: 12.4 %
WBC: 7.9 10*3/uL (ref 4.6–10.2)

## 2014-08-30 MED ORDER — LORAZEPAM 0.5 MG PO TABS: 0.5000 mg | ORAL_TABLET | Freq: Three times a day (TID) | ORAL | Status: AC | PRN

## 2014-08-30 MED ORDER — BUSPIRONE HCL 15 MG PO TABS: 15.0000 mg | ORAL_TABLET | Freq: Two times a day (BID) | ORAL | Status: DC

## 2014-08-30 MED ORDER — METOPROLOL SUCCINATE ER 100 MG PO TB24: 100.0000 mg | ORAL_TABLET | Freq: Every day | ORAL | Status: AC

## 2014-08-30 MED ORDER — ATORVASTATIN CALCIUM 40 MG PO TABS: 40.0000 mg | ORAL_TABLET | Freq: Every day | ORAL | Status: DC

## 2014-08-30 MED ORDER — METOPROLOL SUCCINATE ER 100 MG PO TB24: 100.0000 mg | ORAL_TABLET | Freq: Every day | ORAL | Status: DC

## 2014-08-30 MED ORDER — ATORVASTATIN CALCIUM 40 MG PO TABS: 40.0000 mg | ORAL_TABLET | Freq: Every day | ORAL | Status: AC

## 2014-08-30 MED ORDER — TIZANIDINE HCL 4 MG PO TABS: 4.0000 mg | ORAL_TABLET | Freq: Four times a day (QID) | ORAL | Status: AC | PRN

## 2014-08-30 MED ORDER — BUSPIRONE HCL 15 MG PO TABS: 15.0000 mg | ORAL_TABLET | Freq: Two times a day (BID) | ORAL | Status: AC

## 2014-08-30 NOTE — Progress Notes (Signed)
Subjective:  Patient ID: Stephen Campos, male    DOB: September 27, 1965  Age: 49 y.o. MRN: [id number]  CC: Hyperlipidemia; Vit D deficiency; and Hypertension   HPI Stephen Campos presents for  follow-up of hypertension. Patient has no history of headache chest pain or shortness of breath or recent cough. Patient also denies symptoms of TIA such as numbness weakness lateralizing. Patient checks  blood pressure at home and has not had any elevated readings recently. Patient denies side effects from his medication. States taking it regularly.  Patient also  in for follow-up of elevated cholesterol. Doing well without complaints on current medication. Denies side effects of statin including myalgia and arthralgia and nausea. Also in today for liver function testing. Currently no chest pain, shortness of breath or other cardiovascular related symptoms noted.    History Stephen Campos has a past medical history of Anxiety; Benign hypertension; Bulging lumbar disc; and Panic attacks.   He has past surgical history that includes Appendectomy; Hernia repair; and Hydrocele excision / repair.   His family history is not on file.He reports that he has been smoking Cigarettes.  He has been smoking about 1.00 pack per day. He does not have any smokeless tobacco history on file. He reports that he does not drink alcohol. His drug history is not on file.  Current Outpatient Prescriptions on File Prior to Visit  Medication Sig Dispense Refill  . Cholecalciferol (VITAMIN D3) 2000 UNITS capsule Take by mouth.    . valACYclovir (VALTREX) 500 MG tablet Take 1 g by mouth daily as needed.     No current facility-administered medications on file prior to visit.    ROS Review of Systems  Constitutional: Negative for fever, chills and diaphoresis.  HENT: Negative for congestion, rhinorrhea and sore throat.   Respiratory: Negative for cough, shortness of breath and wheezing.   Cardiovascular: Negative for chest pain.    Gastrointestinal: Negative for nausea, vomiting, abdominal pain, diarrhea, constipation and abdominal distention.  Genitourinary: Negative for dysuria and frequency.  Musculoskeletal: Negative for joint swelling and arthralgias.  Skin: Negative for rash.  Neurological: Negative for headaches.    Objective:  BP 120/80 mmHg  Pulse 79  Temp(Src) 98.4 F (36.9 C) (Oral)  Ht 6' (1.829 m)  Wt 262 lb 9.6 oz (119.115 kg)  BMI 35.61 kg/m2  BP Readings from Last 3 Encounters:  08/30/14 120/80  03/29/14 142/85    Wt Readings from Last 3 Encounters:  08/30/14 262 lb 9.6 oz (119.115 kg)     Physical Exam  Constitutional: He is oriented to person, place, and time. He appears well-developed and well-nourished. No distress.  HENT:  Head: Normocephalic and atraumatic.  Right Ear: External ear normal.  Left Ear: External ear normal.  Nose: Nose normal.  Mouth/Throat: Oropharynx is clear and moist.  Eyes: Conjunctivae and EOM are normal. Pupils are equal, round, and reactive to light.  Neck: Normal range of motion. Neck supple. No thyromegaly present.  Cardiovascular: Normal rate, regular rhythm and normal heart sounds.   No murmur heard. Pulmonary/Chest: Effort normal and breath sounds normal. No respiratory distress. He has no wheezes. He has no rales.  Abdominal: Soft. Bowel sounds are normal. He exhibits no distension. There is no tenderness.  Lymphadenopathy:    He has no cervical adenopathy.  Neurological: He is alert and oriented to person, place, and time. He has normal reflexes.  Skin: Skin is warm and dry.  Psychiatric: He has a normal mood and affect. His behavior is  normal. Judgment and thought content normal.    No results found for: HGBA1C  Lab Results  Component Value Date   WBC 7.9 08/30/2014   HGB 15.8 08/30/2014   HCT 48.6 08/30/2014   PLT 247 03/29/2014   GLUCOSE 84 08/30/2014   CHOL 155 08/30/2014   TRIG 151* 08/30/2014   HDL 54 08/30/2014   LDLCALC 71  08/30/2014   ALT 31 08/30/2014   AST 18 08/30/2014   NA 144 08/30/2014   K 4.7 08/30/2014   CL 104 08/30/2014   CREATININE 0.85 08/30/2014   BUN 14 08/30/2014   CO2 23 08/30/2014   TSH 1.920 03/09/2014    Ct Head Wo Contrast  03/29/2014   CLINICAL DATA:  49 year old with blurred vision and lightheadedness.  EXAM: CT HEAD WITHOUT CONTRAST  TECHNIQUE: Contiguous axial images were obtained from the base of the skull through the vertex without contrast.  COMPARISON:  None  FINDINGS: No evidence for acute hemorrhage, mass lesion, midline shift, hydrocephalus or large infarct. Visualized paranasal sinuses are clear. No acute bone abnormality. Visualized mastoid air cells are clear.  IMPRESSION: Negative head CT.   Electronically Signed   By: Markus Daft M.D.   On: 03/29/2014 18:51   Mr Brain Wo Contrast  03/29/2014   CLINICAL DATA:  Expressive aphasia today, light headedness with flushing, palpitations, vision changes, nausea, diarrhea.  EXAM: MRI HEAD WITHOUT CONTRAST  TECHNIQUE: Multiplanar, multiecho pulse sequences of the brain and surrounding structures were obtained without intravenous contrast.  COMPARISON:  CT of the head March 29, 2010 1833 hours  FINDINGS: The ventricles and sulci are normal. No abnormal parenchymal signal, mass lesions or mass of affect. No reduced diffusion to suggest acute ischemia. No susceptibility artifact to suggest hemorrhage.  No abnormal extra-axial fluid collection. Normal major intracranial vascular flow voids noted at the skull base. No abnormal calvarial bone marrow signal. Ocular globes and orbital contents are unremarkable though not tailored for evaluation. Visualized paranasal sinuses and mastoid air cells appear well-aerated. No abnormal sellar expansion. Craniocervical junction is maintained.  IMPRESSION: No acute intracranial process ; normal noncontrast MRI of the brain.   Electronically Signed   By: Elon Alas   On: 03/29/2014 22:35     Assessment & Plan:   Stephen was seen today for hyperlipidemia, vit d deficiency and hypertension.  Diagnoses and all orders for this visit:  Hyperlipemia Orders: -     Discontinue: atorvastatin (LIPITOR) 40 MG tablet; Take 1 tablet (40 mg total) by mouth daily. -     CMP14+EGFR; Standing -     Lipid panel; Standing -     CMP14+EGFR -     Lipid panel -     atorvastatin (LIPITOR) 40 MG tablet; Take 1 tablet (40 mg total) by mouth daily.  Vitamin D deficiency Orders: -     CMP14+EGFR; Standing -     Vit D  25 hydroxy (rtn osteoporosis monitoring) -     CMP14+EGFR  Screening Orders: -     POCT CBC; Standing -     POCT CBC  Essential hypertension, benign  Other fatigue Orders: -     LORazepam (ATIVAN) 0.5 MG tablet; Take 1 tablet (0.5 mg total) by mouth 3 (three) times daily as needed.  Other orders -     Discontinue: busPIRone (BUSPAR) 15 MG tablet; Take 1 tablet (15 mg total) by mouth 2 (two) times daily. -     Discontinue: metoprolol succinate (TOPROL-XL) 100 MG 24 hr  tablet; Take 1 tablet (100 mg total) by mouth daily. -     metoprolol succinate (TOPROL-XL) 100 MG 24 hr tablet; Take 1 tablet (100 mg total) by mouth daily. -     tiZANidine (ZANAFLEX) 4 MG tablet; Take 1 tablet (4 mg total) by mouth every 6 (six) hours as needed for muscle spasms. -     busPIRone (BUSPAR) 15 MG tablet; Take 1 tablet (15 mg total) by mouth 2 (two) times daily.   I have discontinued Mr. Campos HYDROcodone-acetaminophen. I am also having him maintain his Vitamin D3, valACYclovir, LORazepam, metoprolol succinate, tiZANidine, atorvastatin, and busPIRone.  Meds ordered this encounter  Medications  . DISCONTD: atorvastatin (LIPITOR) 40 MG tablet    Sig: Take 1 tablet (40 mg total) by mouth daily.    Dispense:  30 tablet    Refill:  11  . DISCONTD: busPIRone (BUSPAR) 15 MG tablet    Sig: Take 1 tablet (15 mg total) by mouth 2 (two) times daily.    Dispense:  60 tablet    Refill:  5  .  DISCONTD: metoprolol succinate (TOPROL-XL) 100 MG 24 hr tablet    Sig: Take 1 tablet (100 mg total) by mouth daily.    Dispense:  30 tablet    Refill:  5  . LORazepam (ATIVAN) 0.5 MG tablet    Sig: Take 1 tablet (0.5 mg total) by mouth 3 (three) times daily as needed.    Dispense:  90 tablet    Refill:  5  . metoprolol succinate (TOPROL-XL) 100 MG 24 hr tablet    Sig: Take 1 tablet (100 mg total) by mouth daily.    Dispense:  30 tablet    Refill:  5  . tiZANidine (ZANAFLEX) 4 MG tablet    Sig: Take 1 tablet (4 mg total) by mouth every 6 (six) hours as needed for muscle spasms.    Dispense:  60 tablet    Refill:  5  . atorvastatin (LIPITOR) 40 MG tablet    Sig: Take 1 tablet (40 mg total) by mouth daily.    Dispense:  30 tablet    Refill:  11  . busPIRone (BUSPAR) 15 MG tablet    Sig: Take 1 tablet (15 mg total) by mouth 2 (two) times daily.    Dispense:  60 tablet    Refill:  5     Follow-up: Return in about 6 months (around 03/02/2015) for hypertension, cholesterol.  Claretta Fraise, M.D.

## 2014-08-31 LAB — CMP14+EGFR
ALBUMIN: 4.1 g/dL (ref 3.5–5.5)
ALT: 31 IU/L (ref 0–44)
AST: 18 IU/L (ref 0–40)
Albumin/Globulin Ratio: 1.7 (ref 1.1–2.5)
Alkaline Phosphatase: 76 IU/L (ref 39–117)
BUN/Creatinine Ratio: 16 (ref 9–20)
BUN: 14 mg/dL (ref 6–24)
Bilirubin Total: 0.4 mg/dL (ref 0.0–1.2)
CALCIUM: 9.2 mg/dL (ref 8.7–10.2)
CHLORIDE: 104 mmol/L (ref 97–108)
CO2: 23 mmol/L (ref 18–29)
CREATININE: 0.85 mg/dL (ref 0.76–1.27)
GFR calc Af Amer: 119 mL/min/{1.73_m2} (ref >59)
GFR calc non Af Amer: 103 mL/min/{1.73_m2} (ref >59)
GLOBULIN, TOTAL: 2.4 g/dL (ref 1.5–4.5)
GLUCOSE: 84 mg/dL (ref 65–99)
Potassium: 4.7 mmol/L (ref 3.5–5.2)
Sodium: 144 mmol/L (ref 134–144)
Total Protein: 6.5 g/dL (ref 6.0–8.5)

## 2014-08-31 LAB — LIPID PANEL
CHOL/HDL RATIO: 2.9 ratio (ref 0.0–5.0)
Cholesterol, Total: 155 mg/dL (ref 100–199)
HDL: 54 mg/dL (ref >39)
LDL Calculated: 71 mg/dL (ref 0–99)
TRIGLYCERIDES: 151 mg/dL — AB (ref 0–149)
VLDL Cholesterol Cal: 30 mg/dL (ref 5–40)

## 2014-08-31 LAB — VITAMIN D 25 HYDROXY (VIT D DEFICIENCY, FRACTURES): Vit D, 25-Hydroxy: 25.5 ng/mL — ABNORMAL LOW (ref 30.0–100.0)

## 2014-10-31 ENCOUNTER — Other Ambulatory Visit: Payer: Self-pay | Admitting: Family Medicine

## 2014-10-31 NOTE — Telephone Encounter (Signed)
Still has refills, pt aware

## 2015-01-08 ENCOUNTER — Other Ambulatory Visit: Payer: Self-pay | Admitting: Family Medicine

## 2015-01-09 ENCOUNTER — Telehealth: Payer: Self-pay | Admitting: Family Medicine

## 2015-01-09 NOTE — Telephone Encounter (Signed)
Pt notified of RX Verbalizes understanding 

## 2015-01-09 NOTE — Telephone Encounter (Signed)
Last seen 08/30/14  Dr Darlyn ReadStacks

## 2015-01-09 NOTE — Telephone Encounter (Signed)
Very rude to the switchboard. This was sent straight to Dr. Darlyn ReadStacks this am

## 2015-03-05 ENCOUNTER — Other Ambulatory Visit: Payer: Self-pay | Admitting: *Deleted

## 2015-03-05 DIAGNOSIS — R5383 Other fatigue: Secondary | ICD-10-CM

## 2015-03-05 NOTE — Telephone Encounter (Signed)
Last filled 02/04/15, last seen 08/30/14. Call in [phone]

## 2016-10-01 IMAGING — MR MR HEAD W/O CM
9 of 11 series · 34 of 48 positions shown · non-contrast
Comparison: CT of the head [DATE], 9069 6344 hours

CLINICAL DATA: Expressive aphasia today, light headedness with
flushing, palpitations, vision changes, nausea, diarrhea.

EXAM:
MRI HEAD WITHOUT CONTRAST
TECHNIQUE: Multiplanar, multiecho pulse sequences of the brain and surrounding
structures were obtained without intravenous contrast.

[Series 2: FLAIR · sagittal · 5.0mm · 0.47mm/px · 1 of 23 slices shown (1 of 2)]
[im 1/23]
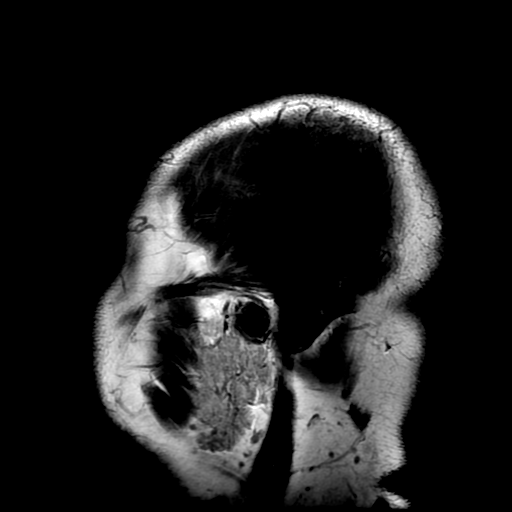

[Series 4: DWI · axial · 3.0mm · 0.94mm/px · z∈[-100,+42]mm · 9 of 100 slices shown (1 of 4)]
[im 1/100]
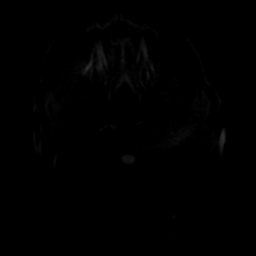
[im 13/100]
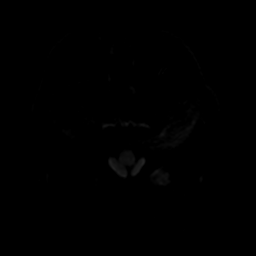
[im 25/100]
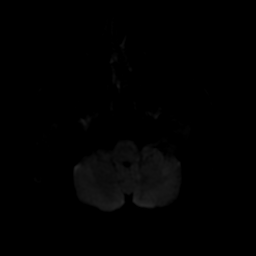
[im 38/100]
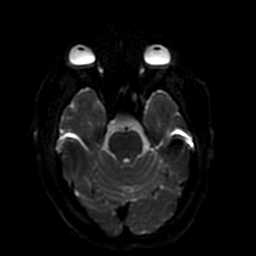
[im 50/100]
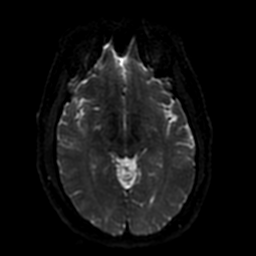
[im 62/100]
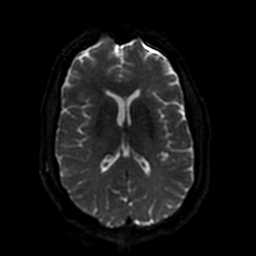
[im 75/100]
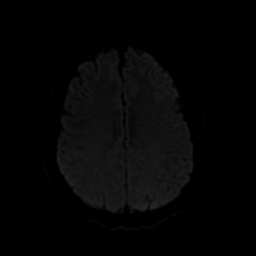
[im 87/100]
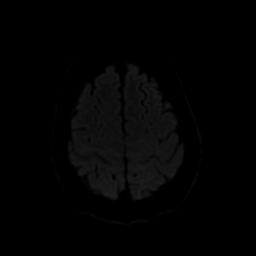
[im 100/100]
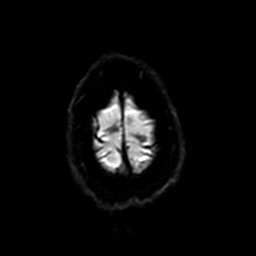

[Series 5: T2 · axial · 5.0mm · 0.47mm/px · z∈[-99,+40]mm · 2 of 25 slices shown (1 of 2)]
[im 1/25]
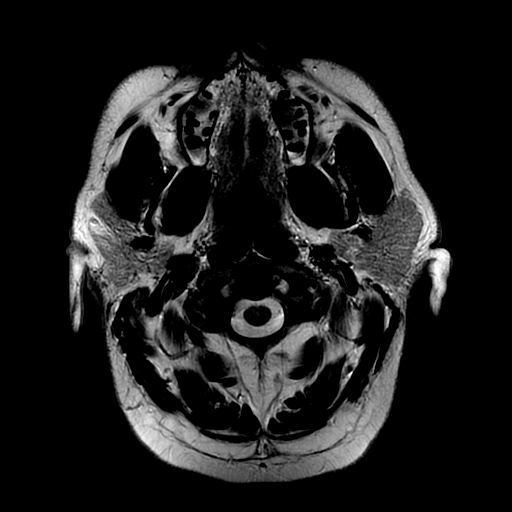
[im 25/25]
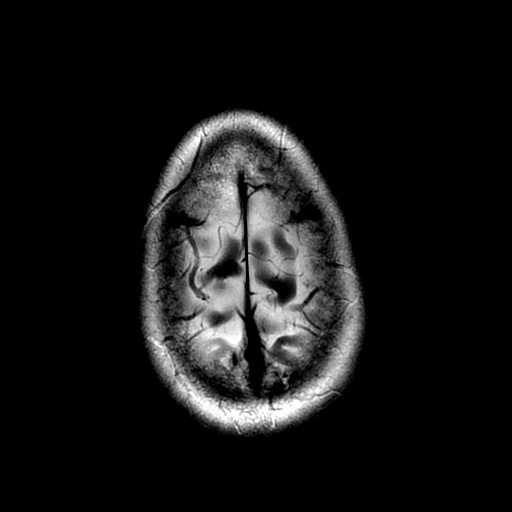

[Series 6: FLAIR · axial · 5.0mm · 0.47mm/px · z∈[-99,+40]mm · 2 of 25 slices shown (2 of 2)]
[im 1/25]
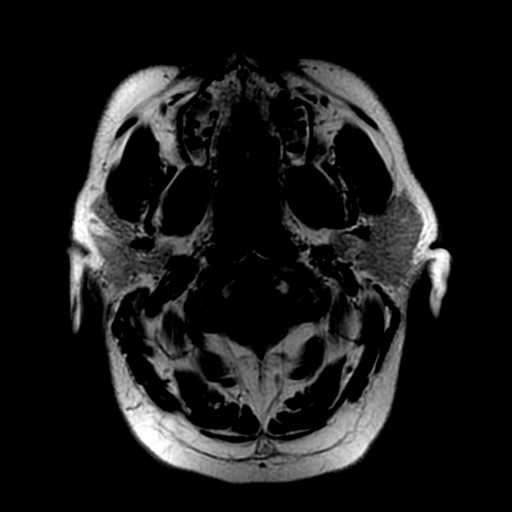
[im 25/25]
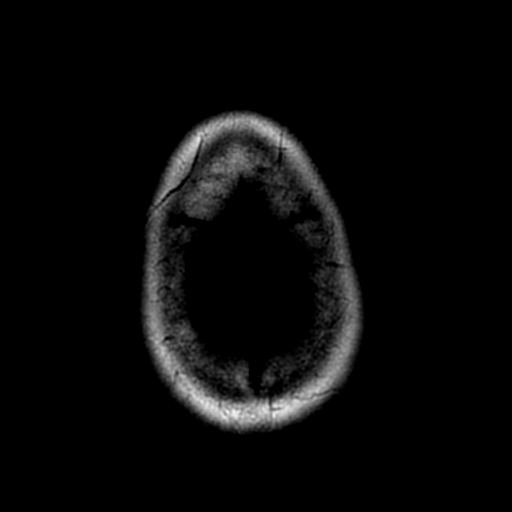

[Series 7: DWI · coronal · 5.0mm · 0.94mm/px · 6 of 72 slices shown (2 of 4)]
[im 1/72]
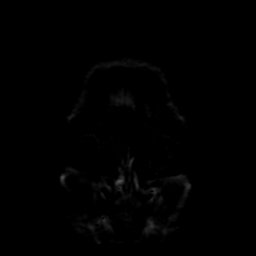
[im 15/72]
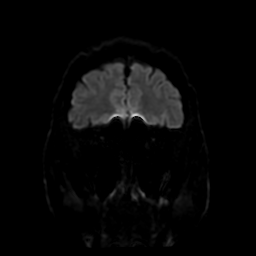
[im 29/72]
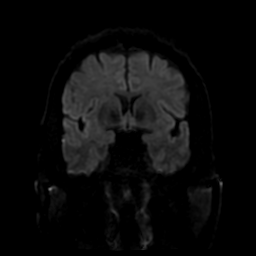
[im 43/72]
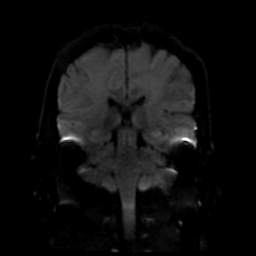
[im 57/72]
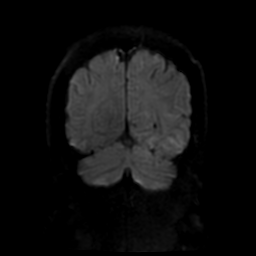
[im 72/72]
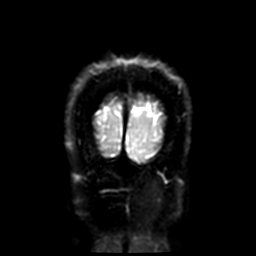

[Series 8: (person_name) · axial · 3.0mm · 0.47mm/px · z∈[-100,-47]mm · 4 of 100 slices shown]
[im 1/100]
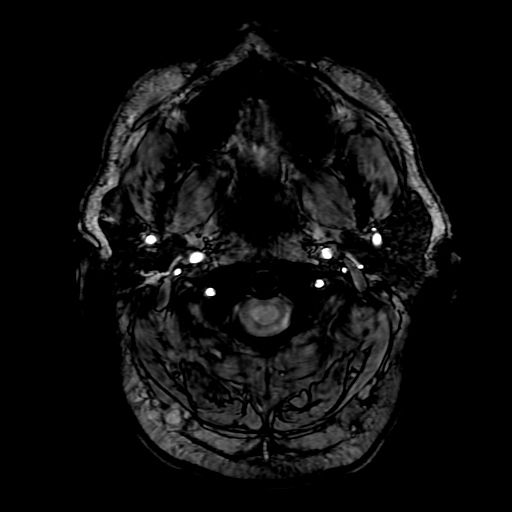
[im 13/100]
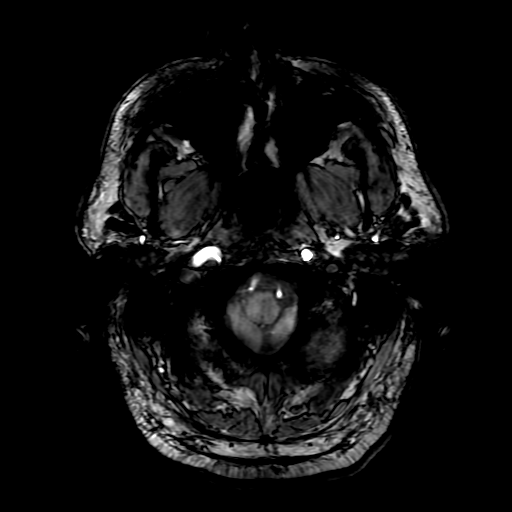
[im 25/100]
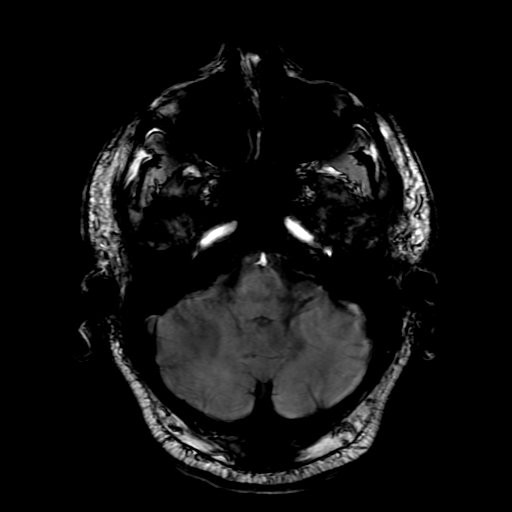
[im 38/100]
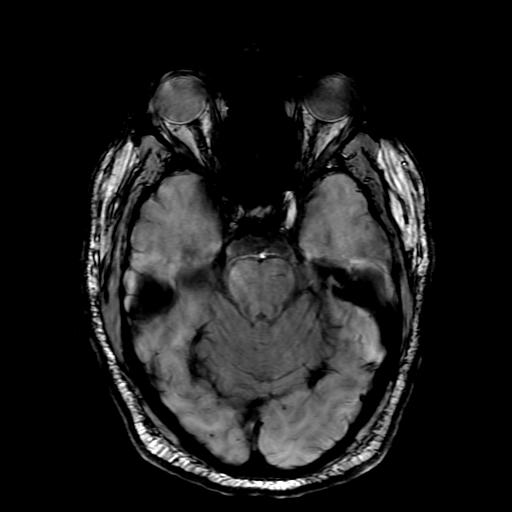

[Series 10: T2 · coronal · 5.0mm · 0.47mm/px · 3 of 30 slices shown (2 of 2)]
[im 1/30]
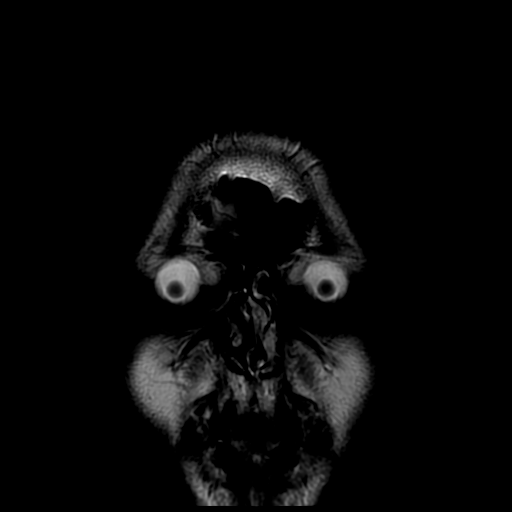
[im 15/30]
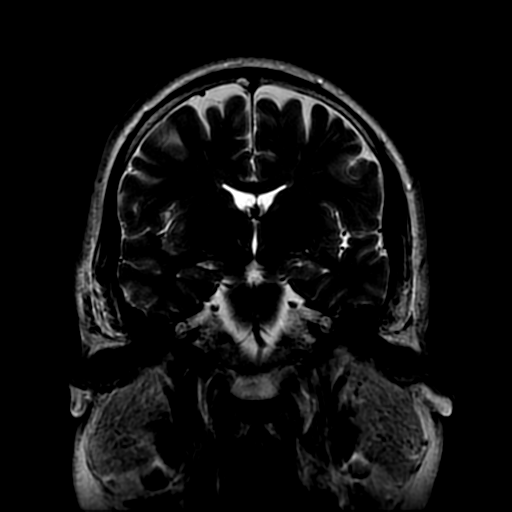
[im 30/30]
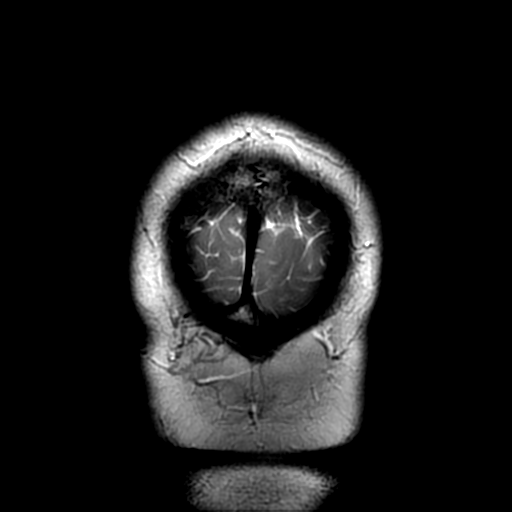

[Series 400: DWI · axial · 3.0mm · 0.94mm/px · z∈[-100,+42]mm · 4 of 50 slices shown (3 of 4)]
[im 1/50]
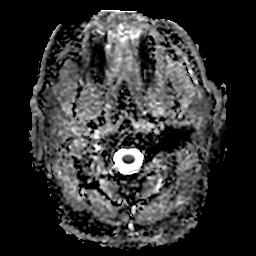
[im 17/50]
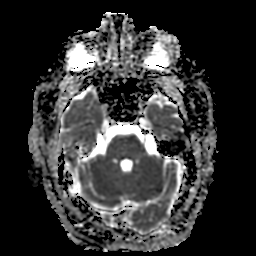
[im 33/50]
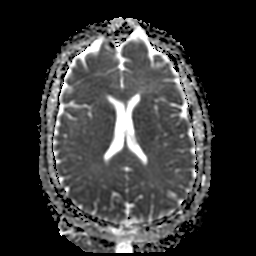
[im 50/50]
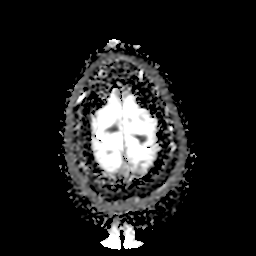

[Series 700: DWI · coronal · 5.0mm · 0.94mm/px · 3 of 35 slices shown (4 of 4)]
[im 1/35]
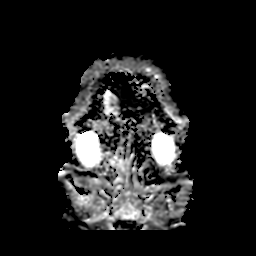
[im 18/35]
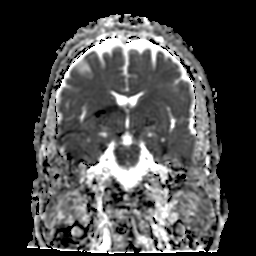
[im 35/35]
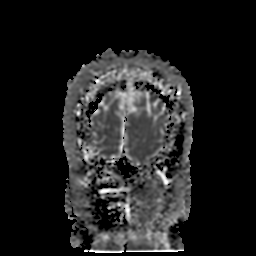

[34 of 48 positions shown; findings below may reference images not displayed]

FINDINGS: The ventricles and sulci are normal. No abnormal parenchymal signal,
mass lesions or mass of affect. No reduced diffusion to suggest
acute ischemia. No susceptibility artifact to suggest hemorrhage.

No abnormal extra-axial fluid collection. Normal major intracranial
vascular flow voids noted at the skull base. No abnormal calvarial
bone marrow signal. Ocular globes and orbital contents are
unremarkable though not tailored for evaluation. Visualized
paranasal sinuses and mastoid air cells appear well-aerated. No
abnormal sellar expansion. Craniocervical junction is maintained.
IMPRESSION: No acute intracranial process ; normal noncontrast MRI of the brain.

  By: Rose N Peter A. Cassar
